# Patient Record
Sex: Female | Born: 1995 | Race: Black or African American | Hispanic: No | Marital: Single | Smoking: Never smoker
Health system: Southern US, Community
[De-identification: ages and names within clinical notes are randomized; demographics above are authoritative.]

---

## 2018-04-23 ENCOUNTER — Emergency Department (HOSPITAL_COMMUNITY)
Admission: EM | Admit: 2018-04-23 | Discharge: 2018-04-23 | Disposition: A | Discharge status: Home / Self Care | Payer: Self-pay | Attending: Emergency Medicine | Admitting: Emergency Medicine

## 2018-04-23 ENCOUNTER — Encounter (HOSPITAL_COMMUNITY): Payer: Self-pay | Admitting: Emergency Medicine

## 2018-04-23 ENCOUNTER — Emergency Department (HOSPITAL_COMMUNITY): Payer: Self-pay

## 2018-04-23 DIAGNOSIS — N83209 Unspecified ovarian cyst, unspecified side: Secondary | ICD-10-CM

## 2018-04-23 DIAGNOSIS — N83202 Unspecified ovarian cyst, left side: Secondary | ICD-10-CM | POA: Insufficient documentation

## 2018-04-23 LAB — CBC WITH DIFFERENTIAL/PLATELET
Abs Immature Granulocytes: 0.02 10*3/uL (ref 0.00–0.07)
Basophils Absolute: 0 10*3/uL (ref 0.0–0.1)
Basophils Relative: 0 %
Eosinophils Absolute: 0.2 10*3/uL (ref 0.0–0.5)
Eosinophils Relative: 1 %
HCT: 41.9 % (ref 36.0–46.0)
Hemoglobin: 14.1 g/dL (ref 12.0–15.0)
Immature Granulocytes: 0 %
LYMPHS ABS: 3.6 10*3/uL (ref 0.7–4.0)
Lymphocytes Relative: 35 %
MCH: 32.4 pg (ref 26.0–34.0)
MCHC: 33.7 g/dL (ref 30.0–36.0)
MCV: 96.3 fL (ref 80.0–100.0)
MONOS PCT: 6 %
Monocytes Absolute: 0.6 10*3/uL (ref 0.1–1.0)
Neutro Abs: 6 10*3/uL (ref 1.7–7.7)
Neutrophils Relative %: 58 %
Platelets: 316 10*3/uL (ref 150–400)
RBC: 4.35 MIL/uL (ref 3.87–5.11)
RDW: 12 % (ref 11.5–15.5)
WBC: 10.5 10*3/uL (ref 4.0–10.5)
nRBC: 0 % (ref 0.0–0.2)

## 2018-04-23 LAB — COMPREHENSIVE METABOLIC PANEL
ALT: 47 U/L — ABNORMAL HIGH (ref 0–44)
AST: 29 U/L (ref 15–41)
Albumin: 4.4 g/dL (ref 3.5–5.0)
Alkaline Phosphatase: 87 U/L (ref 38–126)
Anion gap: 10 (ref 5–15)
BILIRUBIN TOTAL: 0.7 mg/dL (ref 0.3–1.2)
BUN: 8 mg/dL (ref 6–20)
CO2: 24 mmol/L (ref 22–32)
CREATININE: 0.7 mg/dL (ref 0.44–1.00)
Calcium: 9.8 mg/dL (ref 8.9–10.3)
Chloride: 106 mmol/L (ref 98–111)
GFR calc Af Amer: >60 mL/min (ref >60)
GFR calc non Af Amer: >60 mL/min (ref >60)
Glucose, Bld: 96 mg/dL (ref 70–99)
Potassium: 4 mmol/L (ref 3.5–5.1)
Sodium: 140 mmol/L (ref 135–145)
Total Protein: 7.9 g/dL (ref 6.5–8.1)

## 2018-04-23 LAB — URINALYSIS, ROUTINE W REFLEX MICROSCOPIC
Bilirubin Urine: NEGATIVE
Glucose, UA: NEGATIVE mg/dL
Hgb urine dipstick: NEGATIVE
Ketones, ur: NEGATIVE mg/dL
Leukocytes, UA: NEGATIVE
Nitrite: NEGATIVE
Protein, ur: NEGATIVE mg/dL
SPECIFIC GRAVITY, URINE: 1.009 (ref 1.005–1.030)
pH: 6 (ref 5.0–8.0)

## 2018-04-23 LAB — I-STAT BETA HCG BLOOD, ED (MC, WL, AP ONLY): I-stat hCG, quantitative: <5 m[IU]/mL (ref <5)

## 2018-04-23 LAB — LIPASE, BLOOD: Lipase: 30 U/L (ref 11–51)

## 2018-04-23 MED ORDER — IOHEXOL 300 MG/ML  SOLN
100.0000 mL | Freq: Once | INTRAMUSCULAR | Status: AC | PRN
  Administered 2018-04-23: 100 mL via INTRAVENOUS

## 2018-04-23 MED ORDER — ONDANSETRON HCL 4 MG/2ML IJ SOLN
4.0000 mg | Freq: Once | INTRAMUSCULAR | Status: AC
  Administered 2018-04-23: 4 mg via INTRAVENOUS
  Filled 2018-04-23: qty 2

## 2018-04-23 MED ORDER — IBUPROFEN 600 MG PO TABS: 600.0000 mg | ORAL_TABLET | Freq: Four times a day (QID) | ORAL | 0 refills | Status: AC | PRN

## 2018-04-23 MED ORDER — IBUPROFEN 400 MG PO TABS
600.0000 mg | ORAL_TABLET | Freq: Once | ORAL | Status: AC
  Administered 2018-04-23: 600 mg via ORAL
  Filled 2018-04-23: qty 1

## 2018-04-23 MED ORDER — FAMOTIDINE 20 MG PO TABS: 20.0000 mg | ORAL_TABLET | Freq: Two times a day (BID) | ORAL | 0 refills | Status: AC

## 2018-04-23 MED ORDER — FAMOTIDINE 20 MG PO TABS
20.0000 mg | ORAL_TABLET | Freq: Once | ORAL | Status: AC
  Administered 2018-04-23: 20 mg via ORAL
  Filled 2018-04-23: qty 1

## 2018-04-23 MED ORDER — SODIUM CHLORIDE 0.9 % IV BOLUS
1000.0000 mL | Freq: Once | INTRAVENOUS | Status: AC
  Administered 2018-04-23: 1000 mL via INTRAVENOUS

## 2018-04-23 MED ORDER — MORPHINE SULFATE (PF) 4 MG/ML IV SOLN
4.0000 mg | Freq: Once | INTRAVENOUS | Status: AC
  Administered 2018-04-23: 4 mg via INTRAVENOUS
  Filled 2018-04-23: qty 1

## 2018-04-23 NOTE — ED Triage Notes (Signed)
Pt to ER for evaluation of nausea, vomiting, and epigastric abdominal pain onset last night at 11PM. Reports early this morning she was dry heaving and noticed blood in her emesis. NAD at this time. VSS.

## 2018-04-23 NOTE — Discharge Instructions (Signed)
Please read instructions below. Take Pepcid daily.  You can take ibuprofen every 6 hours as needed for pain. Use a heating pad for abdominal discomfort. Establish care with the women's outpatient clinic to follow-up.

## 2018-04-23 NOTE — ED Provider Notes (Signed)
[facility]   CSN: [id number] Arrival date & time: 04/23/18  0735     History   Chief Complaint Chief Complaint  Patient presents with  . Emesis    HPI Alison Ramos is a 23 y.o. female without significant PMHx, presenting to the ED w acute onset of vomiting that began early this morning. States she had a small amount of red blood in her vomit, that occurred one time.  Pt reports RLQ/suprapubic abd pain prior to emesis, with assoc HA. Pain is sharp and constant, worse with palpation. No medications tried PTA. Last BM was normal yesterday evening. Denies diarrhea, fever, urinary sx, vaginal bleeding or discharge. Has an IUD in place, with irregular periods. No hx abdominal surgery.  Patient was sexually active a few months prior.  The history is provided by the patient.    History reviewed. No pertinent past medical history.  There are no active problems to display for this patient.   History reviewed. No pertinent surgical history.   OB History   No obstetric history on file.      Home Medications    Prior to Admission medications   Medication Sig Start Date End Date Taking? Authorizing Provider  famotidine (PEPCID) 20 MG tablet Take 1 tablet (20 mg total) by mouth 2 (two) times daily. 04/23/18   Maguire Killmer, [place] N, PA-C  ibuprofen (ADVIL,MOTRIN) 600 MG tablet Take 1 tablet (600 mg total) by mouth every 6 (six) hours as needed for mild pain or moderate pain. 04/23/18   Janna Oak, [place] N, PA-C    Family History History reviewed. No pertinent family history.  Social History Social History   Tobacco Use  . Smoking status: Never Smoker  . Smokeless tobacco: Never Used  Substance Use Topics  . Alcohol use: Not on file  . Drug use: Not on file     Allergies   Patient has no allergy information on record.   Review of Systems Review of Systems  Constitutional: Negative for fever.  Gastrointestinal:  Positive for abdominal pain, nausea and vomiting. Negative for constipation and diarrhea.  Genitourinary: Negative for dysuria, frequency, vaginal bleeding and vaginal discharge.  All other systems reviewed and are negative.    Physical Exam Updated Vital Signs BP 115/81 (BP Location: Right Arm)   Pulse 62   Temp 98.4 F (36.9 C) (Oral)   Resp 16   SpO2 99%   Physical Exam Vitals signs and nursing note reviewed.  Constitutional:      General: She is not in acute distress.    Appearance: She is well-developed. She is not ill-appearing.  HENT:     Head: Normocephalic and atraumatic.     Mouth/Throat:     Mouth: Mucous membranes are moist.  Eyes:     Conjunctiva/sclera: Conjunctivae normal.  Cardiovascular:     Rate and Rhythm: Normal rate and regular rhythm.  Pulmonary:     Effort: Pulmonary effort is normal.     Breath sounds: Normal breath sounds.  Abdominal:     General: Bowel sounds are normal. There is no distension.     Palpations: Abdomen is soft. There is no mass.     Tenderness: There is abdominal tenderness in the right lower quadrant and suprapubic area. There is no rebound. Negative signs include Rovsing's sign.     Comments: Patient with mild guarding with palpation of the right lower quadrant.  Skin:    General: Skin is warm.  Neurological:  Mental Status: She is alert.  Psychiatric:        Behavior: Behavior normal.      ED Treatments / Results  Labs (all labs ordered are listed, but only abnormal results are displayed) Labs Reviewed  COMPREHENSIVE METABOLIC PANEL - Abnormal; Notable for the following components:      Result Value   ALT 47 (*)    All other components within normal limits  URINALYSIS, ROUTINE W REFLEX MICROSCOPIC - Abnormal; Notable for the following components:   Color, Urine STRAW (*)    All other components within normal limits  LIPASE, BLOOD  CBC WITH DIFFERENTIAL/PLATELET  I-STAT BETA HCG BLOOD, ED ([facility], [facility], [facility] ONLY)     EKG None  Radiology Ct Abdomen Pelvis W Contrast  Result Date: 04/23/2018 CLINICAL DATA:  Abdominal pain with nausea and vomiting EXAM: CT ABDOMEN AND PELVIS WITH CONTRAST TECHNIQUE: Multidetector CT imaging of the abdomen and pelvis was performed using the standard protocol following bolus administration of intravenous contrast. CONTRAST:  OMNIPAQUE IOHEXOL 300 MG/ML  SOLN COMPARISON:  None. FINDINGS: Lower chest: Lung bases are clear. Hepatobiliary: No focal liver lesions are apparent. Gallbladder wall is not appreciably thickened. There is no biliary duct dilatation. Pancreas: No pancreatic mass or inflammatory focus. Spleen: No splenic lesions are evident. Adrenals/Urinary Tract: Adrenals bilaterally appear unremarkable. Kidneys bilaterally show no evident mass or hydronephrosis on either side. There is a junctional parenchymal defect in each kidney, an anatomic variant. There is no evident renal or ureteral calculus on either side. Urinary bladder is midline with wall thickness within normal limits. Stomach/Bowel: There is no appreciable bowel wall or mesenteric thickening. There is moderate stool in the colon. There is no evident bowel obstruction. There is no free air or portal venous air. Vascular/Lymphatic: There is no abdominal aortic aneurysm. No vascular lesions are evident. There is no appreciable adenopathy in the abdomen or pelvis. Reproductive: Uterus is anteverted. Intrauterine device is positioned within the endometrium. There is a collapsed cyst in the left ovary with peripheral enhancement of the area of collapsed wall. Remaining cyst in this area measures 2.1 x 1.8 cm. There is localized fluid adjacent to this cyst in the left adnexal region. There is no free pelvic fluid. No other pelvic mass evident. Other: The appendix appears normal. No periappendiceal region inflammation. There is no abscess or ascites in the abdomen or pelvis. Musculoskeletal: There are no blastic or  lytic bone lesions. 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 There is no intramuscular or abdominal wall lesion. IMPRESSION: 1. Evidence of collapsing cyst in the left ovary with adjacent fluid. No free fluid in the cul-de-sac region. No other pelvic mass. 2.  Intrauterine device positioned within the endometrium. 3. Appendix appears normal. No evident bowel obstruction. No abscess in the abdomen or pelvis. 4. No renal or ureteral calculus. No hydronephrosis. Urinary bladder wall thickness normal. Electronically Signed   By: Bretta Bang III M.D.   On: 04/23/2018 10:48    Procedures Procedures (including critical care time)  Medications Ordered in ED Medications  ondansetron (ZOFRAN) injection 4 mg (4 mg Intravenous Given 04/23/18 0849)  sodium chloride 0.9 % bolus 1,000 mL (0 mLs Intravenous Stopped 04/23/18 0956)  morphine 4 MG/ML injection 4 mg (4 mg Intravenous Given 04/23/18 0849)  iohexol (OMNIPAQUE) 300 MG/ML solution 100 mL (100 mLs Intravenous Contrast Given 04/23/18 1039)  famotidine (PEPCID) tablet 20 mg (20 mg Oral Given 04/23/18 1230)  ibuprofen (ADVIL,MOTRIN) tablet 600 mg (600 mg Oral Given 04/23/18  1230)     Initial Impression / Assessment and Plan / ED Course  I have reviewed the triage vital signs and the nursing notes.  Pertinent labs & imaging results that were available during my care of the patient were reviewed by me and considered in my medical decision making (see chart for details).  Clinical Course as of Apr 24 1339  Thu Apr 23, 2018  1119 Pt re-evaluated, reports improvement in symptoms.  Discussed CT results.  Will p.o. challenge.   [JR]    Clinical Course User Index [JR] Abdoulaye Drum, [place] N, PA-C    Pt presenting with abdominal pain with a couple episodes of vomiting overnight. No vaginal bleeding or discharge. On arrival, afebrile with normal VS. patient is not ill-appearing, and does not appear to be in distress.  Abdomen is soft with right lower  quadrant/suprapubic tenderness, mild guarding with palpation to the right lower quadrant.  Lungs are clear.  Labs and CT scan ordered with concern for possible appendicitis.  Labs are within normal limits.  No leukocytosis.  Normal renal and hepatic function.  Beta-hCG is negative.  CT scan with evidence of a collapsing cyst in the left ovary with adjacent pelvic fluid.  Appendix is normal.  Results discussed with patient.  Symptoms improved with intervention.  States she no longer feels nauseous and that was brief in duration.  Discussed Intermatic management including NSAIDs and topical heat for symptoms.  Women's clinic referral provided for follow-up.  Will prescribe Pepcid to take in conjunction with NSAIDs, given patient's complaint of small amount of blood in her emesis, likely from irritation.  Patient is well-appearing, agreeable to plan and safe for discharge.  Pt discussed with Dr. Clarene Duke, who agrees with care plan.  Discussed results, findings, treatment and follow up. Patient advised of return precautions. Patient verbalized understanding and agreed with plan.  Final Clinical Impressions(s) / ED Diagnoses   Final diagnoses:  Ruptured ovarian cyst    ED Discharge Orders         Ordered    famotidine (PEPCID) 20 MG tablet  2 times daily     04/23/18 1223    ibuprofen (ADVIL,MOTRIN) 600 MG tablet  Every 6 hours PRN     04/23/18 1223           Hebe Merriwether, [place] N, [place] 04/23/18 1343    Geoffery Lyons, MD 04/23/18 1512

## 2019-05-29 IMAGING — CT CT ABD-PELV W/ CM
2 of 4 series · 15 of 46 positions shown, 17 images · IV contrast (omnipaque)
Comparison: None.

CLINICAL DATA: Abdominal pain with nausea and vomiting

EXAM:
CT ABDOMEN AND PELVIS WITH CONTRAST
TECHNIQUE: Multidetector CT imaging of the abdomen and pelvis was performed
using the standard protocol following bolus administration of
intravenous contrast.
CONTRAST:  100mL OMNIPAQUE IOHEXOL 300 MG/ML  SOLN

[Series 3: abdomen 5.0 · axial · 0.68mm/px · z∈[+744,+1169]mm · 12 of 97 slices shown, 14 images]
[im 6/97  soft-tissue]
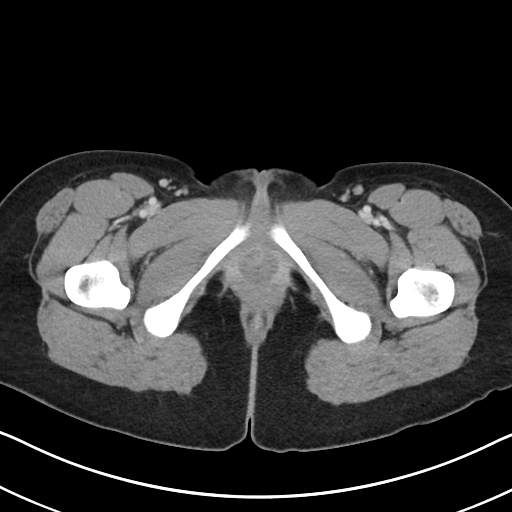
[im 6/97  bone]
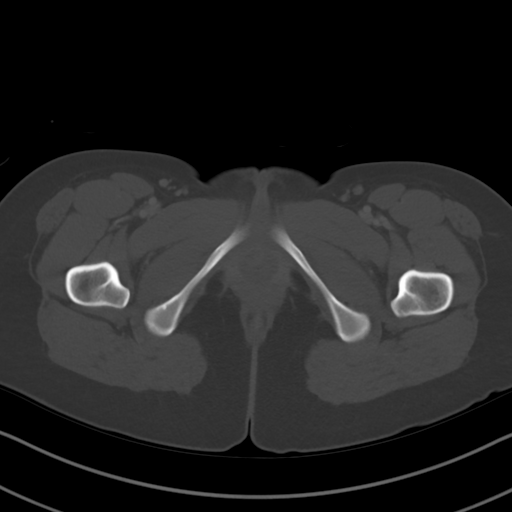
[im 17/97  soft-tissue]
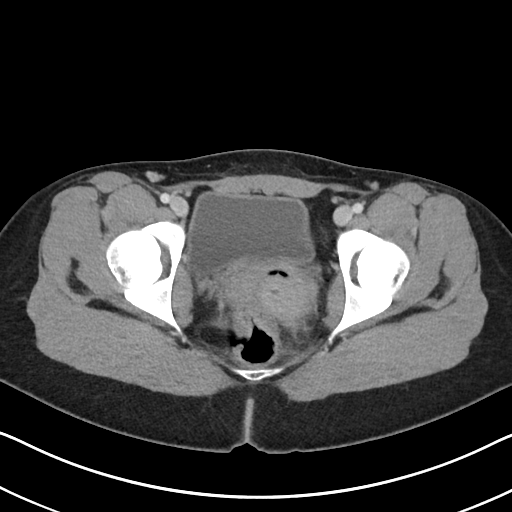
[im 23/97  soft-tissue]
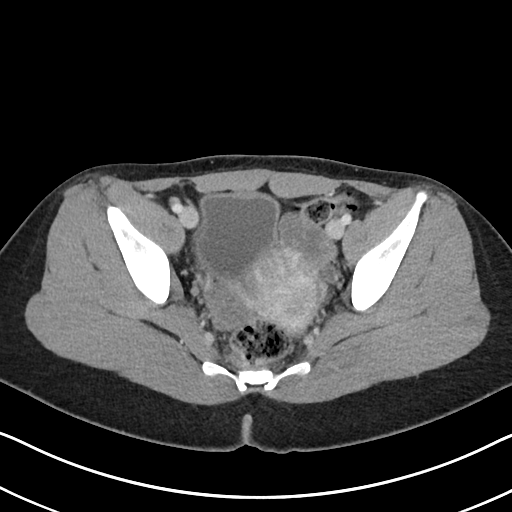
[im 29/97  soft-tissue]
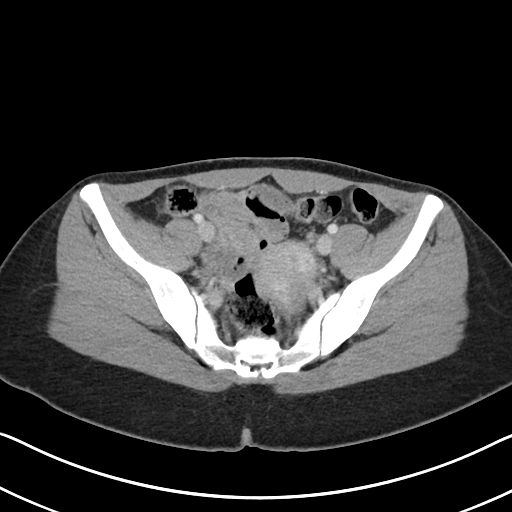
[im 40/97  soft-tissue]
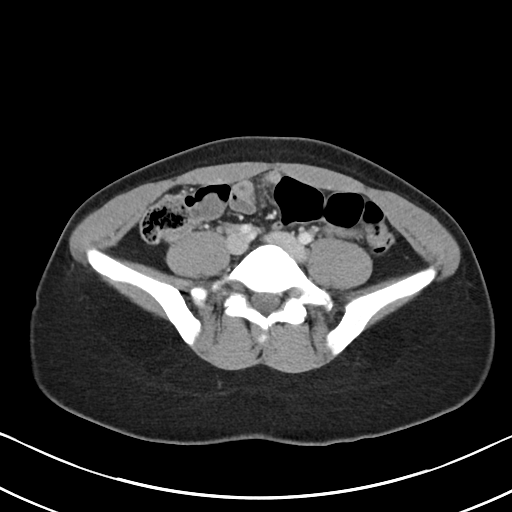
[im 46/97  soft-tissue]
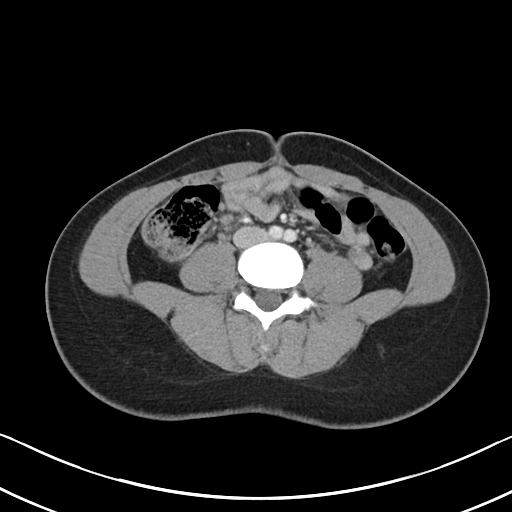
[im 51/97  soft-tissue]
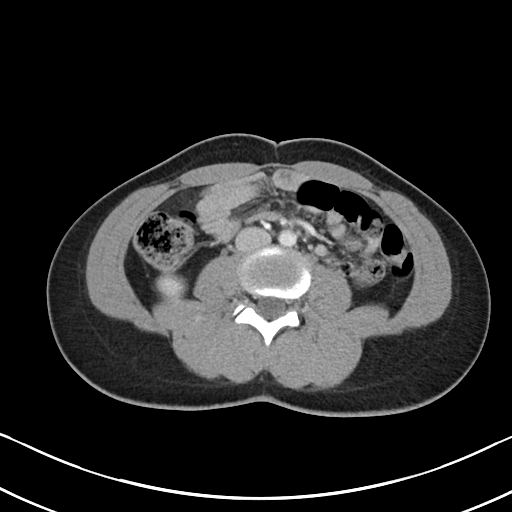
[im 63/97  soft-tissue]
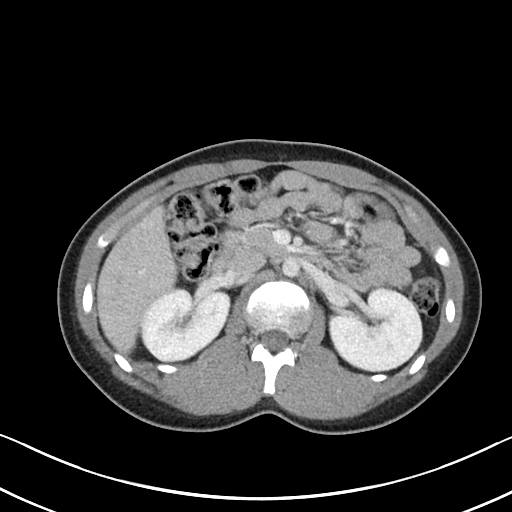
[im 68/97  soft-tissue]
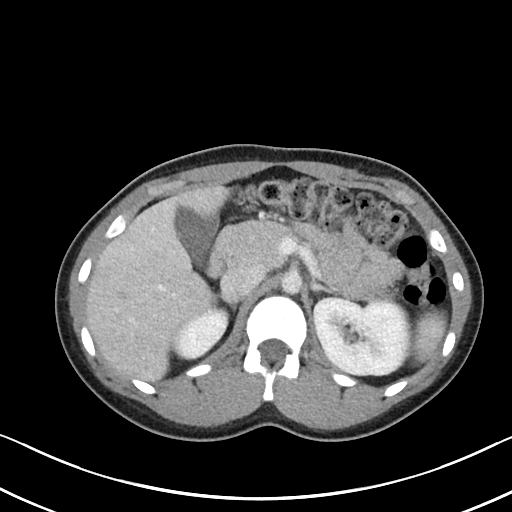
[im 68/97  bone]
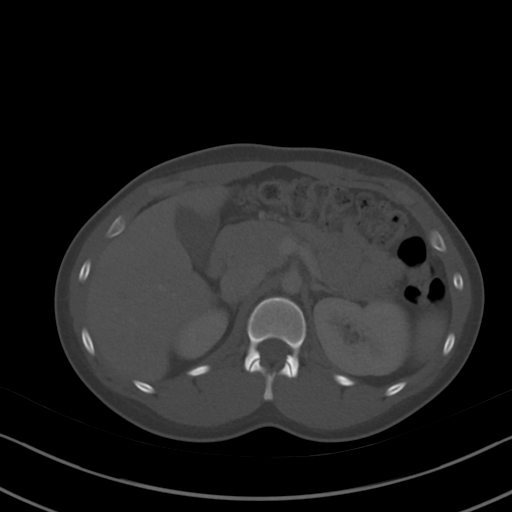
[im 74/97  soft-tissue]
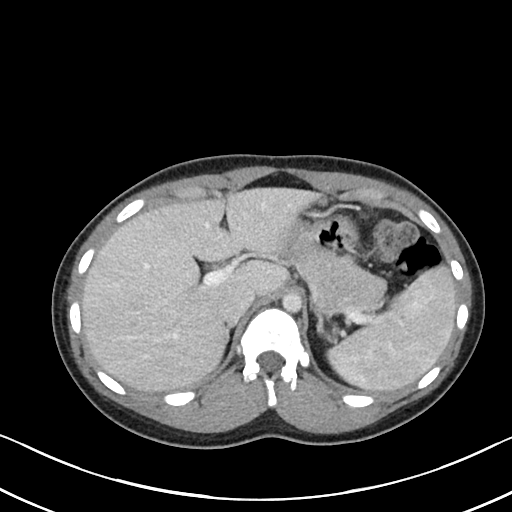
[im 85/97  soft-tissue]
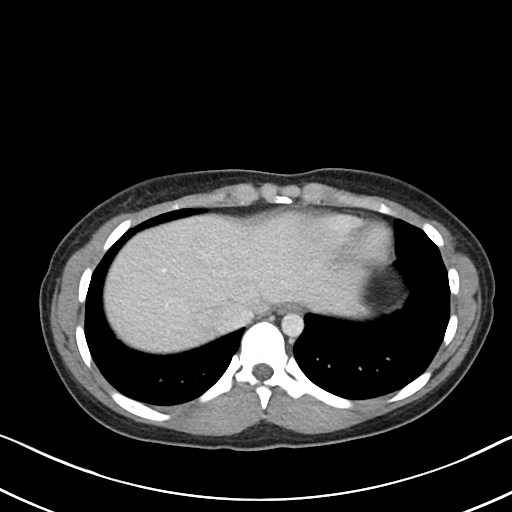
[im 91/97  soft-tissue]
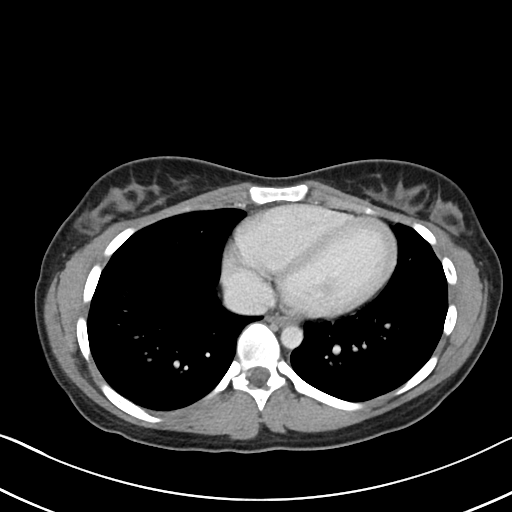

[Series 6: abdomen 3.0 mpr cor · coronal · 0.62mm/px · 3 of 87 slices shown]
[im 29/87  soft-tissue]
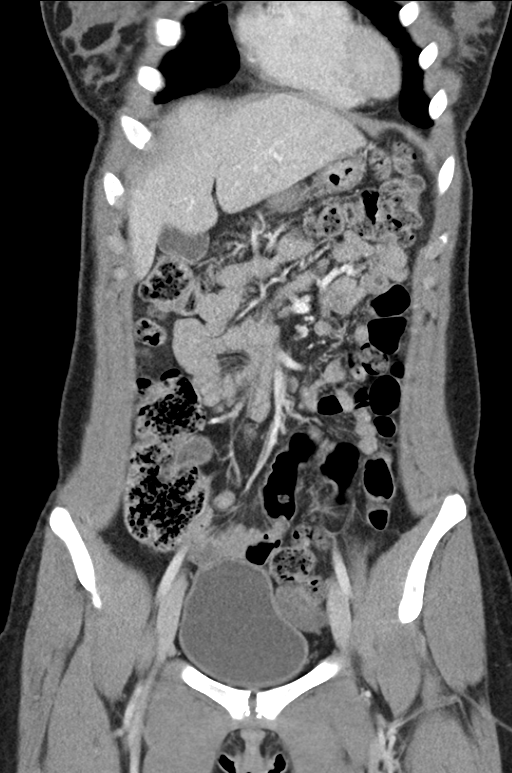
[im 39/87  soft-tissue]
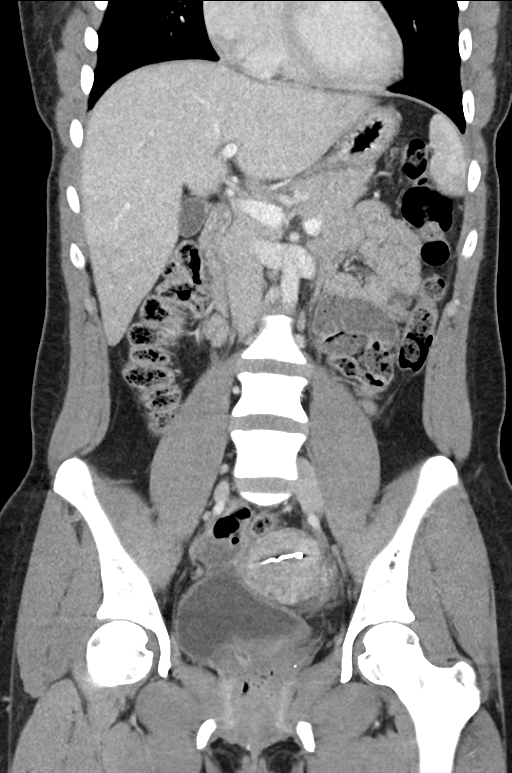
[im 48/87  soft-tissue]
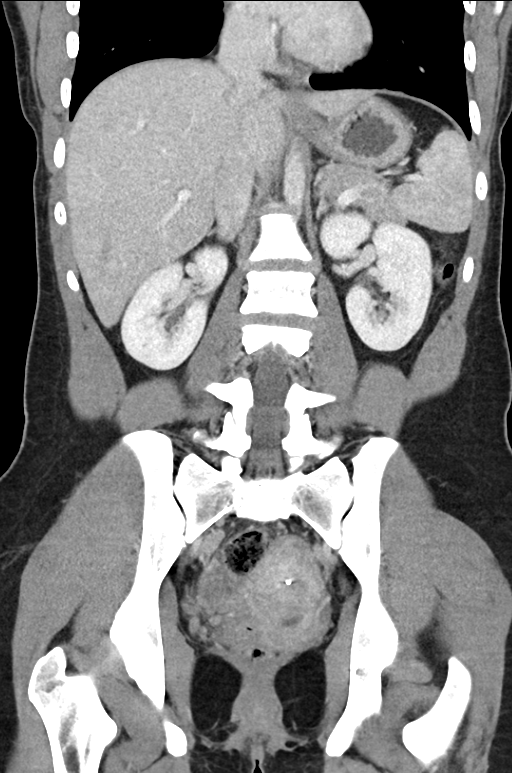

[15 of 46 positions shown; findings below may reference images not displayed]

FINDINGS: Lower chest: Lung bases are clear.

Hepatobiliary: No focal liver lesions are apparent. Gallbladder wall
is not appreciably thickened. There is no biliary duct dilatation.

Pancreas: No pancreatic mass or inflammatory focus.

Spleen: No splenic lesions are evident.

Adrenals/Urinary Tract: Adrenals bilaterally appear unremarkable.
Kidneys bilaterally show no evident mass or hydronephrosis on either
side. There is a junctional parenchymal defect in each kidney, an
anatomic variant. There is no evident renal or ureteral calculus on
either side. Urinary bladder is midline with wall thickness within
normal limits.

Stomach/Bowel: There is no appreciable bowel wall or mesenteric
thickening. There is moderate stool in the colon. There is no
evident bowel obstruction. There is no free air or portal venous
air.

Vascular/Lymphatic: There is no abdominal aortic aneurysm. No
vascular lesions are evident. There is no appreciable adenopathy in
the abdomen or pelvis.

Reproductive: Uterus is anteverted. Intrauterine device is
positioned within the endometrium. There is a collapsed cyst in the
left ovary with peripheral enhancement of the area of collapsed
wall. Remaining cyst in this area measures 2.1 x 1.8 cm. There is
localized fluid adjacent to this cyst in the left adnexal region.
There is no free pelvic fluid. No other pelvic mass evident.

Other: The appendix appears normal. No periappendiceal region
inflammation. There is no abscess or ascites in the abdomen or
pelvis.

Musculoskeletal: There are no blastic or lytic bone lesions. There
is mild thoracolumbar levoscoliosis. There is no intramuscular or
abdominal wall lesion.
IMPRESSION: 1. Evidence of collapsing cyst in the left ovary with adjacent
fluid. No free fluid in the cul-de-sac region. No other pelvic mass.

2.  Intrauterine device positioned within the endometrium.

3. Appendix appears normal. No evident bowel obstruction. No abscess
in the abdomen or pelvis.

4. No renal or ureteral calculus. No hydronephrosis. Urinary bladder
wall thickness normal.
